# Patient Record
Sex: Male | Born: 1985 | Race: Black or African American | Hispanic: No | Marital: Single | Smoking: Never smoker
Health system: Southern US, Community
[De-identification: ages and names within clinical notes are randomized; demographics above are authoritative.]

## PROBLEM LIST (undated history)

## (undated) DIAGNOSIS — M5126 Other intervertebral disc displacement, lumbar region: Secondary | ICD-10-CM

---

## 2017-08-13 ENCOUNTER — Emergency Department (HOSPITAL_BASED_OUTPATIENT_CLINIC_OR_DEPARTMENT_OTHER)
Admission: EM | Admit: 2017-08-13 | Discharge: 2017-08-13 | Disposition: A | Discharge status: Home / Self Care | Payer: Worker's Compensation | Attending: Emergency Medicine | Admitting: Emergency Medicine

## 2017-08-13 ENCOUNTER — Encounter (HOSPITAL_BASED_OUTPATIENT_CLINIC_OR_DEPARTMENT_OTHER): Payer: Self-pay

## 2017-08-13 ENCOUNTER — Other Ambulatory Visit: Payer: Self-pay

## 2017-08-13 DIAGNOSIS — Y990 Civilian activity done for income or pay: Secondary | ICD-10-CM | POA: Insufficient documentation

## 2017-08-13 DIAGNOSIS — Y9289 Other specified places as the place of occurrence of the external cause: Secondary | ICD-10-CM | POA: Diagnosis not present

## 2017-08-13 DIAGNOSIS — T148XXA Other injury of unspecified body region, initial encounter: Secondary | ICD-10-CM

## 2017-08-13 DIAGNOSIS — W1842XA Slipping, tripping and stumbling without falling due to stepping into hole or opening, initial encounter: Secondary | ICD-10-CM | POA: Diagnosis not present

## 2017-08-13 DIAGNOSIS — Y939 Activity, unspecified: Secondary | ICD-10-CM | POA: Insufficient documentation

## 2017-08-13 DIAGNOSIS — W19XXXA Unspecified fall, initial encounter: Secondary | ICD-10-CM

## 2017-08-13 DIAGNOSIS — S8012XA Contusion of left lower leg, initial encounter: Secondary | ICD-10-CM | POA: Insufficient documentation

## 2017-08-13 DIAGNOSIS — S8011XA Contusion of right lower leg, initial encounter: Secondary | ICD-10-CM | POA: Diagnosis not present

## 2017-08-13 HISTORY — DX: Other intervertebral disc displacement, lumbar region: M51.26

## 2017-08-13 LAB — ETHANOL: Alcohol, Ethyl (B): 100 mg/dL — ABNORMAL HIGH (ref <10)

## 2017-08-13 NOTE — ED Provider Notes (Signed)
[facility] EMERGENCY DEPARTMENT Provider Note  CSN: [id number] Arrival date & time: 08/13/17 4098  Chief Complaint(s) Fall  HPI Melvin Hoffman is a 32 y.o. male who presents to the emergency department after mechanical fall while at work.  Patient reports that he stepped into uncovered hole with his right foot causing him to fall into a hole and landing on his right buttock.  He denies any head trauma or loss of consciousness.  He endorses mild right and left leg pain shortly after the fall but has been ambulating on it since.  Started noticing some mild swelling to the anterior upper right lower leg and lateral left lower leg.  Patient also endorsing mild right hip pain.  Denies any other physical complaints at this time.  HPI  Past Medical History Past Medical History:  Diagnosis Date  . Lumbar herniated disc    There are no active problems to display for this patient.  Home Medication(s) Prior to Admission medications   Not on File                                                                                                                                    Past Surgical History History reviewed. No pertinent surgical history. Family History No family history on file.  Social History Social History   Tobacco Use  . Smoking status: Never Smoker  . Smokeless tobacco: Never Used  Substance Use Topics  . Alcohol use: Yes    Comment: weekly  . Drug use: No   Allergies Patient has no known allergies.  Review of Systems Review of Systems All other systems are reviewed and are negative for acute change except as noted in the HPI  Physical Exam Vital Signs  I have reviewed the triage vital signs BP (!) 151/104 (BP Location: Right Arm)   Pulse 98   Temp 98.4 F (36.9 C) (Oral)   Resp 20   Wt 96.8 kg (213 lb 6.5 oz)   SpO2 98%   Physical Exam  Constitutional: He is oriented to person, place, and time. He appears well-developed and well-nourished. No  distress.  HENT:  Head: Normocephalic.  Right Ear: External ear normal.  Left Ear: External ear normal.  Mouth/Throat: Oropharynx is clear and moist.  Eyes: Conjunctivae and EOM are normal. Pupils are equal, round, and reactive to light. Right eye exhibits no discharge. Left eye exhibits no discharge. No scleral icterus.  Neck: Normal range of motion. Neck supple.  Cardiovascular: Regular rhythm and normal heart sounds. Exam reveals no gallop and no friction rub.  No murmur heard. Pulses:      Radial pulses are 2+ on the right side, and 2+ on the left side.       Dorsalis pedis pulses are 2+ on the right side, and 2+ on the left side.  Pulmonary/Chest: Effort normal and breath sounds normal. No stridor. No respiratory distress.  Abdominal:  Soft. He exhibits no distension. There is no tenderness.  Musculoskeletal:       Right hip: He exhibits tenderness. He exhibits normal range of motion, normal strength and no bony tenderness.       Right knee: He exhibits normal range of motion, no swelling and no deformity. No tenderness found.       Left knee: He exhibits normal range of motion, no swelling, no ecchymosis and no deformity. No tenderness found.       Right ankle: He exhibits normal range of motion and no swelling. No tenderness.       Left ankle: He exhibits normal range of motion and no swelling. No tenderness.       Cervical back: He exhibits no bony tenderness.       Thoracic back: He exhibits no bony tenderness.       Lumbar back: He exhibits no bony tenderness.       Legs: Clavicle stable. Chest stable to AP/Lat compression. Pelvis stable to Lat compression. No obvious extremity deformity. No chest or abdominal wall contusion.  Neurological: He is alert and oriented to person, place, and time. GCS eye subscore is 4. GCS verbal subscore is 5. GCS motor subscore is 6.  Moving all extremities   Skin: Skin is warm. He is not diaphoretic.    ED Results and  Treatments Labs (all labs ordered are listed, but only abnormal results are displayed) Labs Reviewed  ETHANOL                                                                                                                         EKG  EKG Interpretation  Date/Time:    Ventricular Rate:    PR Interval:    QRS Duration:   QT Interval:    QTC Calculation:   R Axis:     Text Interpretation:        Radiology No results found. Pertinent labs & imaging results that were available during my care of the patient were reviewed by me and considered in my medical decision making (see chart for details).  Medications Ordered in ED Medications - No data to display                                                                                                                                  Procedures Procedures  (including critical care time)  Medical  Decision Making / ED Course I have reviewed the nursing notes for this encounter and the patient's prior records (if available in EHR or on provided paperwork).    Injuries appear to be soft tissue contusions and abrasions.  No bony tenderness to palpation.  No obvious deformities.  No indication for imaging at this time.  He is able to ambulate without complication or significant discomfort.  The patient appears reasonably screened and/or stabilized for discharge and I doubt any other medical condition or other [facility] requiring further screening, evaluation, or treatment in the ED at this time prior to discharge.  The patient is safe for discharge with strict return precautions.   Final Clinical Impression(s) / ED Diagnoses Final diagnoses:  None   Disposition: Discharge  Condition: Good  I have discussed the results, Dx and Tx plan with the patient who expressed understanding and agree(s) with the plan. Discharge instructions discussed at great length. The patient was given strict return precautions who verbalized understanding of the  instructions. No further questions at time of discharge.    ED Discharge Orders    None       Follow Up: Primary care provider   If you do not have a primary care physician, contact [facility] at [phone] for referral      This chart was dictated using voice recognition software.  Despite best efforts to proofread,  errors can occur which can change the documentation meaning.   Nira Conn, MD 08/13/17 2153

## 2017-08-13 NOTE — ED Triage Notes (Signed)
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

## 2017-08-13 NOTE — ED Notes (Signed)
Pt did not have paperwork-advised to call supervisor to find if WC UDS is needed and let staff know-agreed

## 2018-10-18 ENCOUNTER — Emergency Department (HOSPITAL_BASED_OUTPATIENT_CLINIC_OR_DEPARTMENT_OTHER)
Admission: EM | Admit: 2018-10-18 | Discharge: 2018-10-18 | Disposition: A | Discharge status: Home / Self Care | Payer: Self-pay | Attending: Emergency Medicine | Admitting: Emergency Medicine

## 2018-10-18 ENCOUNTER — Emergency Department (HOSPITAL_BASED_OUTPATIENT_CLINIC_OR_DEPARTMENT_OTHER): Payer: Self-pay

## 2018-10-18 ENCOUNTER — Other Ambulatory Visit: Payer: Self-pay

## 2018-10-18 ENCOUNTER — Encounter (HOSPITAL_BASED_OUTPATIENT_CLINIC_OR_DEPARTMENT_OTHER): Payer: Self-pay | Admitting: Emergency Medicine

## 2018-10-18 DIAGNOSIS — M109 Gout, unspecified: Secondary | ICD-10-CM | POA: Insufficient documentation

## 2018-10-18 MED ORDER — PREDNISONE 20 MG PO TABS: 60.0000 mg | ORAL_TABLET | Freq: Every day | ORAL | 0 refills | Status: DC

## 2018-10-18 MED ORDER — NAPROXEN 500 MG PO TABS: 500.0000 mg | ORAL_TABLET | Freq: Two times a day (BID) | ORAL | 0 refills | Status: DC

## 2018-10-18 NOTE — ED Notes (Signed)
ED Provider at bedside. 

## 2018-10-18 NOTE — ED Provider Notes (Signed)
[facility] EMERGENCY DEPARTMENT Provider Note   CSN: [id number] Arrival date & time: 10/18/18  1217    History   Chief Complaint Chief Complaint  Patient presents with  . Toe Pain    HPI Melvin Hoffman is a 33 y.o. male.     Patient is a 33 year old male with no prior medical history presenting with a 5-week history of left great toe pain.  Patient states approximately 5 weeks ago is getting out of bed and he moved his foot and he felt a pop and noticed significant pain in the first MTP joint of the left foot.  He states over the next few days the symptoms worsened and he developed more swelling.  He was seen at urgent care and diagnosed with gout.  He had never had a history of gout but his father had gout and he was placed on prednisone and anti-inflammatories.  He states after 1 week symptoms are mildly improved but still present and he went back and saw someone at the [facility] care at that time was placed on colchicine and more prednisone.  He states that he took that for an additional week and symptoms had improved but not completely.  Over the last 3 to 4 days he started to have more swelling and pain again in the same area.  He does drink beer daily but has cut down as well as the amount of red meat he is eating.  He takes no acute medications otherwise and has been taking Aleve for the pain.  He does not work a job where he is on his feet for his entire shift which also makes his foot hurt worse.  He denies any known trauma, fevers or spreading of the pain into his leg.  He has no cough or shortness of breath.  The history is provided by the patient.    Past Medical History:  Diagnosis Date  . Lumbar herniated disc     There are no active problems to display for this patient.   History reviewed. No pertinent surgical history.      Home Medications    Prior to Admission medications   Medication Sig Start Date End Date Taking? Authorizing Provider   naproxen (NAPROSYN) 500 MG tablet Take 1 tablet (500 mg total) by mouth 2 (two) times daily. 10/18/18   Gwyneth Sprout, MD  predniSONE (DELTASONE) 20 MG tablet Take 3 tablets (60 mg total) by mouth daily. Take 60mg  (3 tabs) po for 5 days, then take 40mg  (2tabs) po for 2 days, then 20mg  (1 tab) po for 2 days and then 10mg  (0.5 tab)po for 2 days. 10/18/18   Gwyneth Sprout, MD    Family History No family history on file.  Social History Social History   Tobacco Use  . Smoking status: Never Smoker  . Smokeless tobacco: Never Used  Substance Use Topics  . Alcohol use: Yes    Comment: weekly  . Drug use: No     Allergies   Patient has no known allergies.   Review of Systems Review of Systems  All other systems reviewed and are negative.    Physical Exam Updated Vital Signs BP (!) 152/95 (BP Location: Right Arm)   Pulse 96   Temp 98.2 F (36.8 C) (Oral)   Resp 16   Ht 5\' 10"  (1.778 m)   Wt 98.4 kg   SpO2 98%   BMI 31.14 kg/m   Physical Exam Vitals signs and nursing note  reviewed.  Constitutional:      General: He is not in acute distress.    Appearance: Normal appearance. He is normal weight.  HENT:     Head: Normocephalic.  Eyes:     Pupils: Pupils are equal, round, and reactive to light.  Neck:     Musculoskeletal: Normal range of motion.  Cardiovascular:     Rate and Rhythm: Normal rate.     Pulses: Normal pulses.  Pulmonary:     Effort: Pulmonary effort is normal.  Musculoskeletal:        General: Tenderness present.     Left ankle: Normal.       Feet:  Neurological:     General: No focal deficit present.     Mental Status: He is alert and oriented to person, place, and time. Mental status is at baseline.  Psychiatric:        Mood and Affect: Mood normal.        Behavior: Behavior normal.        Thought Content: Thought content normal.      ED Treatments / Results  Labs (all labs ordered are listed, but only abnormal results are  displayed) Labs Reviewed - No data to display  EKG None  Radiology Dg Foot Complete Left  Result Date: 10/18/2018 CLINICAL DATA:  Left foot swelling and pain for 1 month. Treated for gout. No known injury. EXAM: LEFT FOOT - COMPLETE 3+ VIEW COMPARISON:  None. FINDINGS: The mineralization and alignment are normal. There is no evidence of acute fracture or dislocation. The joint spaces are preserved. No erosive changes are identified. There is prominent soft tissue swelling around the 1st metatarsophalangeal joint, especially medially. No soft tissue calcifications, foreign bodies or emphysema identified. IMPRESSION: Medial forefoot soft tissue swelling is suggestive of gout based on location, but nonspecific. No acute osseous findings or erosive changes identified. Electronically Signed   By: Carey BullocksWilliam  Veazey M.D.   On: 10/18/2018 13:48    Procedures Procedures (including critical care time)  Medications Ordered in ED Medications - No data to display   Initial Impression / Assessment and Plan / ED Course  I have reviewed the triage vital signs and the nursing notes.  Pertinent labs & imaging results that were available during my care of the patient were reviewed by me and considered in my medical decision making (see chart for details).       Patient presenting with symptoms most classic for gout in the left great toe however is been persistent for 5 weeks with only occasional improvement after taking prednisone.  Patient has no infectious symptoms and low suspicion for septic joint.  X-ray shows a medial forefoot soft tissue swelling suggestive of gout but no acute osseous findings or erosive changes.  Suspect this is still persistent gout and patient given a repeat prescription for prednisone and naproxen.  Also given follow-up with sports medicine if symptoms do not improve.  Patient may be a candidate for allopurinol once this flare resolves.  Final Clinical Impressions(s) / ED  Diagnoses   Final diagnoses:  Acute gout involving toe of left foot, unspecified cause    ED Discharge Orders         Ordered    naproxen (NAPROSYN) 500 MG tablet  2 times daily     10/18/18 1415    predniSONE (DELTASONE) 20 MG tablet  Daily     10/18/18 1415           Gwyneth SproutPlunkett, Mirca Yale, MD  10/18/18 1521  

## 2018-10-18 NOTE — ED Triage Notes (Signed)
L great toe pain and swelling for over a month. He was treated for gout by UC and reports no improvement.

## 2019-11-20 ENCOUNTER — Encounter (HOSPITAL_BASED_OUTPATIENT_CLINIC_OR_DEPARTMENT_OTHER): Payer: Self-pay | Admitting: Emergency Medicine

## 2019-11-20 ENCOUNTER — Emergency Department (HOSPITAL_BASED_OUTPATIENT_CLINIC_OR_DEPARTMENT_OTHER): Payer: Self-pay

## 2019-11-20 ENCOUNTER — Emergency Department (HOSPITAL_BASED_OUTPATIENT_CLINIC_OR_DEPARTMENT_OTHER)
Admission: EM | Admit: 2019-11-20 | Discharge: 2019-11-20 | Disposition: A | Discharge status: Home / Self Care | Payer: Self-pay | Attending: Emergency Medicine | Admitting: Emergency Medicine

## 2019-11-20 ENCOUNTER — Other Ambulatory Visit: Payer: Self-pay

## 2019-11-20 DIAGNOSIS — Y9367 Activity, basketball: Secondary | ICD-10-CM | POA: Insufficient documentation

## 2019-11-20 DIAGNOSIS — Y929 Unspecified place or not applicable: Secondary | ICD-10-CM | POA: Insufficient documentation

## 2019-11-20 DIAGNOSIS — X501XXA Overexertion from prolonged static or awkward postures, initial encounter: Secondary | ICD-10-CM | POA: Insufficient documentation

## 2019-11-20 DIAGNOSIS — S93491A Sprain of other ligament of right ankle, initial encounter: Secondary | ICD-10-CM | POA: Insufficient documentation

## 2019-11-20 DIAGNOSIS — Y999 Unspecified external cause status: Secondary | ICD-10-CM | POA: Insufficient documentation

## 2019-11-20 NOTE — Discharge Instructions (Signed)
Please read and follow all provided instructions.  Your diagnoses today include:  1. Sprain of anterior talofibular ligament of right ankle, initial encounter     Tests performed today include:  An x-ray of your ankle - does NOT show any broken bones  Vital signs. See below for your results today.   Medications prescribed:  Please use over-the-counter NSAID medications (ibuprofen, naproxen) as directed on the packaging for pain.   Take any prescribed medications only as directed.  Home care instructions:   Follow any educational materials contained in this packet  Follow R.I.C.E. Protocol:  R - rest your injury   I  - use ice on injury without applying directly to skin  C - compress injury with bandage or splint  E - elevate the injury as much as possible  Follow-up instructions: Please follow-up with your primary care provider or the provided orthopedic (bone specialist) if you continue to have significant pain or trouble walking in 1 week. In this case you may have a severe sprain that requires further care.   Return instructions:   Please return if your toes are numb or tingling, appear gray or blue, or you have severe pain (also elevate leg and loosen splint or wrap)  Please return to the Emergency Department if you experience worsening symptoms.   Please return if you have any other emergent concerns.  Additional Information:  Your vital signs today were: BP (!) 149/107 (BP Location: Right Arm)    Pulse 82    Temp 98.1 F (36.7 C) (Oral)    Resp 18    Ht 5\' 9"  (1.753 m)    Wt 93 kg    SpO2 97%    BMI 30.27 kg/m  If your blood pressure (BP) was elevated above 135/85 this visit, please have this repeated by your doctor within one month. -------------- Your caregiver has diagnosed you as suffering from an ankle sprain. Ankle sprain occurs when the ligaments that hold the ankle joint together are stretched or torn. It may take 4 to 6 weeks to heal.  For Activity:  If prescribed crutches, use crutches with non-weight bearing for the first few days. Then, you may walk on your ankle as the pain allows, or as instructed. Start gradually with weight bearing on the affected ankle. Once you can walk pain free, then try jogging. When you can run forwards, then you can try moving side-to-side. If you cannot walk without crutches in one week, you need a re-check. --------------

## 2019-11-20 NOTE — ED Provider Notes (Signed)
[facility] EMERGENCY DEPARTMENT Provider Note   CSN: [id number] Arrival date & time: 11/20/19  1131     History Chief Complaint  Patient presents with  . Ankle Injury    Melvin Hoffman is a 34 y.o. male.  Patient presents to the emergency department for right ankle injury sustained 1 week ago.  Patient states that he was playing basketball and jumped and landed awkwardly.  He rolled over on his left ankle.  Since that time he has had pain and swelling.  He has been ambulatory.  Pain is worse at night.  Home meds have not been helping.  Pain radiates up into the calf without swelling.  No knee pain.        Past Medical History:  Diagnosis Date  . Lumbar herniated disc     There are no problems to display for this patient.   History reviewed. No pertinent surgical history.     No family history on file.  Social History   Tobacco Use  . Smoking status: Never Smoker  . Smokeless tobacco: Never Used  Substance Use Topics  . Alcohol use: Yes    Comment: weekly  . Drug use: No    Home Medications Prior to Admission medications   Not on File    Allergies    Patient has no known allergies.  Review of Systems   Review of Systems  Constitutional: Negative for activity change.  Musculoskeletal: Positive for arthralgias and myalgias. Negative for back pain, gait problem, joint swelling and neck pain.  Skin: Negative for wound.  Neurological: Negative for weakness and numbness.    Physical Exam Updated Vital Signs BP (!) 149/107 (BP Location: Right Arm)   Pulse 82   Temp 98.1 F (36.7 C) (Oral)   Resp 18   Ht 5\' 9"  (1.753 m)   Wt 93 kg   SpO2 97%   BMI 30.27 kg/m   Physical Exam Vitals reviewed.  Constitutional:      Appearance: He is well-developed.  HENT:     Head: Normocephalic and atraumatic.  Eyes:     Conjunctiva/sclera: Conjunctivae normal.  Cardiovascular:     Pulses:          Dorsalis pedis pulses are 2+ on the right side and 2+  on the left side.       Posterior tibial pulses are 2+ on the right side and 2+ on the left side.  Pulmonary:     Effort: No respiratory distress.  Musculoskeletal:        General: Tenderness present.     Cervical back: Normal range of motion and neck supple.     Right ankle: Tenderness present over the lateral malleolus. No base of 5th metatarsal or proximal fibula tenderness. Normal range of motion.  Skin:    General: Skin is warm and dry.  Neurological:     Mental Status: He is alert.     Comments: Distal motor, sensation, and vascular intact.     ED Results / Procedures / Treatments   Labs (all labs ordered are listed, but only abnormal results are displayed) Labs Reviewed - No data to display  EKG None  Radiology DG Ankle Complete Right  Result Date: 11/20/2019 CLINICAL DATA:  RIGHT ankle pain.  Basketball injury. EXAM: RIGHT ANKLE - COMPLETE 3+ VIEW COMPARISON:  None. FINDINGS: Ankle mortise intact. The talar dome is normal. No malleolar fracture. The calcaneus is normal. Small joint effusion anterior to the tibiotalar joint. IMPRESSION: 1.  Small joint effusion.  No fracture dislocation. Electronically Signed   By: Genevive Bi M.D.   On: 11/20/2019 11:58    Procedures Procedures (including critical care time)  Medications Ordered in ED Medications - No data to display  ED Course  I have reviewed the triage vital signs and the nursing notes.  Pertinent labs & imaging results that were available during my care of the patient were reviewed by me and considered in my medical decision making (see chart for details).  Patient seen and examined.  X-rays ordered.  Vital signs reviewed and are as follows: BP (!) 149/107 (BP Location: Right Arm)   Pulse 82   Temp 98.1 F (36.7 C) (Oral)   Resp 18   Ht 5\' 9"  (1.753 m)   Wt 93 kg   SpO2 97%   BMI 30.27 kg/m   Patient was counseled on RICE protocol and told to rest injury, use ice for no longer than 15 minutes  every hour, compress the area, and elevate above the level of their heart as much as possible to reduce swelling. Questions answered. Patient verbalized understanding.    X-rays negative.  Encouraged orthopedic follow-up as needed.  Will provide ASO.     MDM Rules/Calculators/A&P                      Patient with ankle injury.  X-rays are negative.  Will continue symptomatic treatment with sports medicine follow-up as needed.   Final Clinical Impression(s) / ED Diagnoses Final diagnoses:  Sprain of anterior talofibular ligament of right ankle, initial encounter    Rx / DC Orders ED Discharge Orders    None       , PA-C 11/20/19 1205    11/22/19, MD 12/01/19 1435

## 2019-11-20 NOTE — ED Notes (Signed)
Portable Xray at bedside.

## 2019-11-20 NOTE — ED Triage Notes (Signed)
He rolled his R ankle 1 week ago playing basketball.

## 2019-12-06 ENCOUNTER — Encounter (HOSPITAL_BASED_OUTPATIENT_CLINIC_OR_DEPARTMENT_OTHER): Payer: Self-pay

## 2019-12-06 ENCOUNTER — Other Ambulatory Visit: Payer: Self-pay

## 2019-12-06 ENCOUNTER — Emergency Department (HOSPITAL_BASED_OUTPATIENT_CLINIC_OR_DEPARTMENT_OTHER)
Admission: EM | Admit: 2019-12-06 | Discharge: 2019-12-06 | Disposition: A | Discharge status: Home / Self Care | Payer: Self-pay | Attending: Emergency Medicine | Admitting: Emergency Medicine

## 2019-12-06 ENCOUNTER — Emergency Department (HOSPITAL_BASED_OUTPATIENT_CLINIC_OR_DEPARTMENT_OTHER): Payer: Self-pay

## 2019-12-06 DIAGNOSIS — Y9367 Activity, basketball: Secondary | ICD-10-CM | POA: Insufficient documentation

## 2019-12-06 DIAGNOSIS — W010XXD Fall on same level from slipping, tripping and stumbling without subsequent striking against object, subsequent encounter: Secondary | ICD-10-CM | POA: Insufficient documentation

## 2019-12-06 DIAGNOSIS — S93431D Sprain of tibiofibular ligament of right ankle, subsequent encounter: Secondary | ICD-10-CM | POA: Insufficient documentation

## 2019-12-06 DIAGNOSIS — Y999 Unspecified external cause status: Secondary | ICD-10-CM | POA: Insufficient documentation

## 2019-12-06 DIAGNOSIS — Y9289 Other specified places as the place of occurrence of the external cause: Secondary | ICD-10-CM | POA: Insufficient documentation

## 2019-12-06 DIAGNOSIS — S93491D Sprain of other ligament of right ankle, subsequent encounter: Secondary | ICD-10-CM

## 2019-12-06 MED ORDER — DICLOFENAC SODIUM 1 % EX GEL: 2.0000 g | Freq: Four times a day (QID) | CUTANEOUS | 0 refills | Status: AC | PRN

## 2019-12-06 MED ORDER — IBUPROFEN 800 MG PO TABS
800.0000 mg | ORAL_TABLET | Freq: Once | ORAL | Status: AC
  Administered 2019-12-06: 800 mg via ORAL
  Filled 2019-12-06: qty 1

## 2019-12-06 MED ORDER — CYCLOBENZAPRINE HCL 10 MG PO TABS: 10.0000 mg | ORAL_TABLET | Freq: Two times a day (BID) | ORAL | 0 refills | Status: AC | PRN

## 2019-12-06 NOTE — ED Triage Notes (Signed)
Pt arrives with continued pain to right ankle from visit on 5/29, tried to follow up with sports medicine, unable to get apt until Wednesday pt reports increasing pain and unable to put weight on right ankle.

## 2019-12-06 NOTE — Discharge Instructions (Addendum)
Fortunately your repeat x-ray did not show any evidence of bony fracture or dislocation.  Keep your ankle elevated while at rest.  Use crutches as needed.  Follow-up with your orthopedist in 2 days as previously scheduled.  You may take Flexeril as needed at nighttime to help you sleep and use Voltaren gel up to 4 times daily for pain.

## 2019-12-06 NOTE — ED Notes (Signed)
Patient transported to X-ray 

## 2019-12-06 NOTE — ED Provider Notes (Signed)
[facility] EMERGENCY DEPARTMENT Provider Note   CSN: [id number] Arrival date & time: 12/06/19  1231     History Chief Complaint  Patient presents with  . Ankle Pain    Melvin Hoffman is a 34 y.o. male.  The history is provided by the patient and medical records. No language interpreter was used.  Ankle Pain Associated symptoms: no fever      34 year old male presenting for evaluation of ankle pain.  Approximately 2 weeks ago patient injured his right ankle when he was playing basketball, jumped and landed awkwardly on ankle and fell down. He was seen in the ER the next day for his ankle injury. An x-ray obtained at that time showed no evidence of acute fracture or dislocation. Patient was recommended rice therapy as well as wearing an ankle brace for support. Patient report his pain has steadily became worse in the past 2 weeks. Pain is sharp for pain radiates towards his mid shin, worse with ambulation and swelling is still present. Pain is not well controlled with anti-inflammatory medication, heat, ice, Epsom salt bath. No fever, no calf pain, no numbness. He does have an appointment with sports medicine specialist in 2 days however due to the progressiveness of his pain he difficulty sleeping at night he is here for evaluation. No prior history of PE or DVT.  Past Medical History:  Diagnosis Date  . Lumbar herniated disc     There are no problems to display for this patient.   History reviewed. No pertinent surgical history.     No family history on file.  Social History   Tobacco Use  . Smoking status: Never Smoker  . Smokeless tobacco: Never Used  Substance Use Topics  . Alcohol use: Yes    Comment: weekly  . Drug use: No    Home Medications Prior to Admission medications   Not on File    Allergies    Patient has no known allergies.  Review of Systems   Review of Systems  Constitutional: Negative for fever.  Musculoskeletal: Positive for  arthralgias and myalgias.  Skin: Negative for rash and wound.  Neurological: Negative for numbness.    Physical Exam Updated Vital Signs BP (!) 155/114 (BP Location: Right Arm)   Pulse (!) 108   Temp 98.6 F (37 C) (Oral)   Resp 18   Ht 5\' 10"  (1.778 m)   Wt 93.9 kg   SpO2 100%   BMI 29.70 kg/m   Physical Exam Vitals and nursing note reviewed.  Constitutional:      General: He is not in acute distress.    Appearance: He is well-developed.  HENT:     Head: Atraumatic.  Eyes:     Conjunctiva/sclera: Conjunctivae normal.  Musculoskeletal:        General: Tenderness (Right ankle: Exquisite tenderness noted to lateral malleoli region with associated edema but no crepitus. Decreased range of motion secondary to pain. Intact dorsalis pedis pulse with brisk cap refill. No significant tenderness to fifth metatarsal region. ) present.     Cervical back: Neck supple.     Comments: Right leg calf soft and nontender.  Skin:    Findings: No rash.  Neurological:     Mental Status: He is alert.     ED Results / Procedures / Treatments   Labs (all labs ordered are listed, but only abnormal results are displayed) Labs Reviewed - No data to display  EKG None  Radiology DG Ankle Complete Right  Result Date: 12/06/2019 CLINICAL DATA:  Acute right ankle pain. EXAM: RIGHT ANKLE - COMPLETE 3+ VIEW COMPARISON:  Nov 20, 2019. FINDINGS: There is no evidence of fracture or dislocation. Small joint effusion may remain. There is no evidence of arthropathy or other focal bone abnormality. Soft tissues are unremarkable. IMPRESSION: No fracture or dislocation is noted. Small joint effusion may be present. Electronically Signed   By: Marijo Conception M.D.   On: 12/06/2019 14:44    Procedures Procedures (including critical care time)  Medications Ordered in ED Medications  ibuprofen (ADVIL) tablet 800 mg (800 mg Oral Given 12/06/19 1417)    ED Course  I have reviewed the triage vital signs  and the nursing notes.  Pertinent labs & imaging results that were available during my care of the patient were reviewed by me and considered in my medical decision making (see chart for details).    MDM Rules/Calculators/A&P                          BP (!) 155/114 (BP Location: Right Arm)   Pulse (!) 108   Temp 98.6 F (37 C) (Oral)   Resp 18   Ht 5\' 10"  (1.778 m)   Wt 93.9 kg   SpO2 100%   BMI 29.70 kg/m   Final Clinical Impression(s) / ED Diagnoses Final diagnoses:  Sprain of anterior talofibular ligament of right ankle, subsequent encounter    Rx / DC Orders ED Discharge Orders         Ordered    diclofenac Sodium (VOLTAREN) 1 % GEL  4 times daily PRN     Discontinue  Reprint     12/06/19 1512    cyclobenzaprine (FLEXERIL) 10 MG tablet  2 times daily PRN     Discontinue  Reprint     12/06/19 1512         2:18 PM Patient sprained his right ankle 2 weeks ago. X-ray at that time was negative however despite rice therapy he report progressive worsening pain and swelling to the ankle. Will obtain repeat x-ray. No evidence of infection. Low suspicion for DVT.  3:08 PM Repeat right ankle x-ray shows no acute fracture or dislocation.  Small joint effusion were noted.  At this time, I strongly encourage patient to keep his leg elevated while at rest.  Crutches provided.  Patient should follow-up with orthopedist in 2 days as previously scheduled.  We will also prescribe Voltaren gel.   Domenic Moras, PA-C 12/06/19 1514    Virgel Manifold, MD 12/07/19 [phone]

## 2019-12-08 ENCOUNTER — Ambulatory Visit: Payer: Self-pay

## 2019-12-08 ENCOUNTER — Encounter: Payer: Self-pay | Admitting: Family Medicine

## 2019-12-08 ENCOUNTER — Ambulatory Visit (INDEPENDENT_AMBULATORY_CARE_PROVIDER_SITE_OTHER): Payer: Self-pay | Admitting: Family Medicine

## 2019-12-08 ENCOUNTER — Other Ambulatory Visit: Payer: Self-pay

## 2019-12-08 VITALS — BP 158/118 | HR 111 | Ht 70.0 in | Wt 210.0 lb

## 2019-12-08 DIAGNOSIS — M25471 Effusion, right ankle: Secondary | ICD-10-CM | POA: Insufficient documentation

## 2019-12-08 DIAGNOSIS — M25571 Pain in right ankle and joints of right foot: Secondary | ICD-10-CM

## 2019-12-08 MED ORDER — HYDROCODONE-ACETAMINOPHEN 5-325 MG PO TABS: 1.0000 | ORAL_TABLET | Freq: Three times a day (TID) | ORAL | 0 refills | Status: DC | PRN

## 2019-12-08 NOTE — Progress Notes (Signed)
Melvin Hoffman - 34 y.o. male MRN [id number]  Date of birth: 11/14/1985  SUBJECTIVE:  Including CC & ROS.  Chief Complaint  Patient presents with  . Ankle Injury    right x 11/19/2019    Melvin Hoffman is a 34 y.o. male that is presenting with right ankle pain.  The pain is been ongoing since 5/28.  His feet came down with the rebound had a inversion injury.  He has been having severe pain since that time.  He has been using crutches and an ankle brace.  No history of similar pain.  He has some altered sensation over the first and second digit of the right foot.  Limited range of motion of his foot..  Independent review of the right ankle x-ray from 5/29 shows an effusion of the joint but no fracture.   Review of Systems See HPI   HISTORY: Past Medical, Surgical, Social, and Family History Reviewed & Updated per EMR.   Pertinent Historical Findings include:  Past Medical History:  Diagnosis Date  . Lumbar herniated disc     No past surgical history on file.  No family history on file.  Social History   Socioeconomic History  . Marital status: Single    Spouse name: Not on file  . Number of children: Not on file  . Years of education: Not on file  . Highest education level: Not on file  Occupational History  . Not on file  Tobacco Use  . Smoking status: Never Smoker  . Smokeless tobacco: Never Used  Substance and Sexual Activity  . Alcohol use: Yes    Comment: weekly  . Drug use: No  . Sexual activity: Not on file  Other Topics Concern  . Not on file  Social History Narrative  . Not on file   Social Determinants of Health   Financial Resource Strain:   . Difficulty of Paying Living Expenses:   Food Insecurity:   . Worried About Programme researcher, broadcasting/film/video in the Last Year:   . Barista in the Last Year:   Transportation Needs:   . Freight forwarder (Medical):   Marland Kitchen Lack of Transportation (Non-Medical):   Physical Activity:   . Days of Exercise per Week:   .  Minutes of Exercise per Session:   Stress:   . Feeling of Stress :   Social Connections:   . Frequency of Communication with Friends and Family:   . Frequency of Social Gatherings with Friends and Family:   . Attends Religious Services:   . Active Member of Clubs or Organizations:   . Attends Banker Meetings:   Marland Kitchen Marital Status:   Intimate Partner Violence:   . Fear of Current or Ex-Partner:   . Emotionally Abused:   Marland Kitchen Physically Abused:   . Sexually Abused:      PHYSICAL EXAM:  VS: BP (!) 158/118   Pulse (!) 111   Ht 5\' 10"  (1.778 m)   Wt 210 lb (95.3 kg)   BMI 30.13 kg/m  Physical Exam Gen: NAD, alert, cooperative with exam, well-appearing MSK:  Right ankle/foot: Limited range of motion. Swelling of the foot and ankle. Pain with anterior drawer. Neurovascular intact  Limited ultrasound: Right ankle/foot:  Significant effusion from the ankle joint itself. Posterior tibialis is intact with effusion emanating from the joint encircling the tendon. Peroneal tendons intact at the lateral malleolus.  Normal-appearing peroneal brevis at the insertion of the base of the fifth.  Hyperemia occurring over the anterior lateral ligaments as well as the medial ligaments.  Summary: Concern for significant disruption of ankle ligaments as well as chondral lesion of the joint causing the effusion.  Ultrasound and interpretation by Clearance Coots, MD    ASSESSMENT & PLAN:   Ankle effusion, right Injury occurred on 5/28.  He has had ongoing pain and limited range of motion since that time.  Concern for significant derangement with ligamentous disruption as well as a chondral injury with a large joint effusion. -Counseled on home exercise therapy and supportive care. -Cam walker. -Provided Duexis samples. -Norco -MRI to evaluate for structural abnormalities.  An effusion

## 2019-12-08 NOTE — Progress Notes (Signed)
Medication Samples have been provided to the patient.  Drug name: Duexis       Strength: 800mg /26.6mg         Qty: 2 Boxes  LOT  Exp.Date: 07/2020  Dosing instructions: Take 1 tablet by mouth three (3) times a day.  The patient has been instructed regarding the correct time, dose, and frequency of taking this medication, including desired effects and most common side effects.   08/2020, MA 11:59 AM 12/08/2019

## 2019-12-08 NOTE — Assessment & Plan Note (Signed)
Injury occurred on 5/28.  He has had ongoing pain and limited range of motion since that time.  Concern for significant derangement with ligamentous disruption as well as a chondral injury with a large joint effusion. -Counseled on home exercise therapy and supportive care. -Cam walker. -Provided Duexis samples. -Norco -MRI to evaluate for structural abnormalities.  An effusion

## 2019-12-08 NOTE — Patient Instructions (Signed)
Nice to meet you Please try a CAM walker  Please try ice  Please use the duexis on a regular basis  Please use the norco for severe pain   Please send me a message in MyChart with any questions or updates.  We will schedule a virtual visit once the MRi is resulted.   --Dr. Jordan Likes

## 2019-12-13 NOTE — Addendum Note (Signed)
Addended by: Kathi Simpers F on: 12/13/2019 03:30 PM   Modules accepted: Orders

## 2019-12-15 ENCOUNTER — Other Ambulatory Visit: Payer: Self-pay

## 2019-12-15 ENCOUNTER — Ambulatory Visit
Admission: RE | Admit: 2019-12-15 | Discharge: 2019-12-15 | Disposition: A | Discharge status: Home / Self Care | Payer: Self-pay | Source: Ambulatory Visit | Attending: Family Medicine | Admitting: Family Medicine

## 2019-12-15 DIAGNOSIS — M25471 Effusion, right ankle: Secondary | ICD-10-CM

## 2019-12-16 ENCOUNTER — Other Ambulatory Visit: Payer: Self-pay | Admitting: Family Medicine

## 2019-12-16 ENCOUNTER — Encounter: Payer: Self-pay | Admitting: Family Medicine

## 2019-12-16 MED ORDER — HYDROCODONE-ACETAMINOPHEN 5-325 MG PO TABS: 1.0000 | ORAL_TABLET | Freq: Three times a day (TID) | ORAL | 0 refills | Status: DC | PRN

## 2019-12-16 NOTE — Addendum Note (Signed)
Addended by: Myra Rude on: 12/16/2019 05:04 PM   Modules accepted: Orders

## 2019-12-16 NOTE — Telephone Encounter (Signed)
Medication Samples have been provided to the patient.  Drug name: duexis       Strength: 800/26.6mg         Qty: 2 boxes  LOT: [id number]  Exp.Date: 07/2020 Dosing instructions: take one tab three times daily The patient has been instructed regarding the correct time, dose, and frequency of taking this medication, including desired effects and most common side effects.   Lillia Pauls 4:46 PM 12/16/2019

## 2019-12-16 NOTE — Telephone Encounter (Signed)
Patient requesting refill of [facility] [address] [facility].   Patient had his MRI yesterday. We are waiting on results to schedule virtual visit

## 2019-12-16 NOTE — Telephone Encounter (Signed)
Provided norco   Myra Rude, MD [facility] 12/16/2019, 5:04 PM

## 2019-12-17 ENCOUNTER — Other Ambulatory Visit: Payer: Self-pay

## 2019-12-17 ENCOUNTER — Telehealth (INDEPENDENT_AMBULATORY_CARE_PROVIDER_SITE_OTHER): Payer: Self-pay | Admitting: Family Medicine

## 2019-12-17 DIAGNOSIS — M25471 Effusion, right ankle: Secondary | ICD-10-CM

## 2019-12-17 NOTE — Progress Notes (Signed)
Virtual Visit via Video Note  I connected with Marily Lente on 12/17/19 at 11:45 AM EDT by a video enabled telemedicine application and verified that I am speaking with the correct person using two identifiers.   I discussed the limitations of evaluation and management by telemedicine and the availability of in person appointments. The patient expressed understanding and agreed to proceed.  Patient: vehicle  Physician: office  History of Present Illness:   Mr. Bencomo is a 34 year old male that is following up for the MRI of the right ankle.  The MRI was revealing for a complete tear of the anterior and inferior tibiofibular ligament and signs consistent with a syndesmosis injury.  There is a 0.3 cm osteochondral lesion of the medial talar dome there is a synovitis of the tibiotalar joint and effusion.  There are multiple bone contusions.  Observations/Objective:  Gen: NAD, alert, cooperative with exam, well-appearing  Assessment and Plan:  Tear of the ATFL, ankle sprain, osteochondral defect, bony contusion: He has multiple injuries to his ankle.  He has an osteochondral defect as well as tear of the ATFL and a high ankle sprain.  He has bone contusions and joint effusion. -Counseled on home exercise therapy and supportive care. -We can consider injection in the joint  -Continue the cam walker and partial weightbearing.  Follow Up Instructions:    I discussed the assessment and treatment plan with the patient. The patient was provided an opportunity to ask questions and all were answered. The patient agreed with the plan and demonstrated an understanding of the instructions.   The patient was advised to call back or seek an in-person evaluation if the symptoms worsen or if the condition fails to improve as anticipated.   Clare Gandy, MD

## 2019-12-17 NOTE — Assessment & Plan Note (Signed)
He has multiple injuries to his ankle.  He has an osteochondral defect as well as tear of the ATFL and a high ankle sprain.  He has bone contusions and joint effusion. -Counseled on home exercise therapy and supportive care. -We can consider injection in the joint  -Continue the cam walker and partial weightbearing.

## 2019-12-18 ENCOUNTER — Other Ambulatory Visit: Payer: Self-pay

## 2019-12-20 ENCOUNTER — Other Ambulatory Visit: Payer: Self-pay

## 2019-12-21 ENCOUNTER — Other Ambulatory Visit: Payer: Self-pay

## 2019-12-21 ENCOUNTER — Ambulatory Visit (INDEPENDENT_AMBULATORY_CARE_PROVIDER_SITE_OTHER): Payer: Self-pay | Admitting: Family Medicine

## 2019-12-21 ENCOUNTER — Encounter: Payer: Self-pay | Admitting: Family Medicine

## 2019-12-21 ENCOUNTER — Ambulatory Visit: Payer: Self-pay

## 2019-12-21 VITALS — BP 153/96 | Ht 71.0 in | Wt 210.0 lb

## 2019-12-21 DIAGNOSIS — S93491D Sprain of other ligament of right ankle, subsequent encounter: Secondary | ICD-10-CM

## 2019-12-21 DIAGNOSIS — S93491A Sprain of other ligament of right ankle, initial encounter: Secondary | ICD-10-CM | POA: Insufficient documentation

## 2019-12-21 DIAGNOSIS — M949 Disorder of cartilage, unspecified: Secondary | ICD-10-CM

## 2019-12-21 DIAGNOSIS — M25471 Effusion, right ankle: Secondary | ICD-10-CM

## 2019-12-21 DIAGNOSIS — M899 Disorder of bone, unspecified: Secondary | ICD-10-CM

## 2019-12-21 MED ORDER — TRIAMCINOLONE ACETONIDE 40 MG/ML IJ SUSP
40.0000 mg | Freq: Once | INTRAMUSCULAR | Status: AC
  Administered 2019-12-21: 10:00:00 40 mg via INTRA_ARTICULAR

## 2019-12-21 NOTE — Assessment & Plan Note (Signed)
Injury occurred on 5/28. MRI revealing  - continue partial or light weight bearing.

## 2019-12-21 NOTE — Patient Instructions (Signed)
Good to see you Please try ice   Please send me a message in MyChart with any questions or updates.  Please see me back in 4 weeks.   --Dr. Antwan Bribiesca  

## 2019-12-21 NOTE — Assessment & Plan Note (Signed)
MRI was revealing for near complete tear of the anterior, inferior tibiofibular ligament and high grade sprain. Instability hard to determine with amount of pain on exam - may need referral if unstable on future exams.  - continue CAM walker

## 2019-12-21 NOTE — Progress Notes (Signed)
Melvin Hoffman - 34 y.o. male MRN [id number]  Date of birth: 1985/12/05  SUBJECTIVE:  Including CC & ROS.  Chief Complaint  Patient presents with  . Follow-up    right ankle    Melvin Hoffman is a 34 y.o. male that is following up for his right ankle pain. MRI was showing high ankle sprain and ocd lesion.    Review of Systems See HPI   HISTORY: Past Medical, Surgical, Social, and Family History Reviewed & Updated per EMR.   Pertinent Historical Findings include:  Past Medical History:  Diagnosis Date  . Lumbar herniated disc     No past surgical history on file.  No family history on file.  Social History   Socioeconomic History  . Marital status: Single    Spouse name: Not on file  . Number of children: Not on file  . Years of education: Not on file  . Highest education level: Not on file  Occupational History  . Not on file  Tobacco Use  . Smoking status: Never Smoker  . Smokeless tobacco: Never Used  Substance and Sexual Activity  . Alcohol use: Yes    Comment: weekly  . Drug use: No  . Sexual activity: Not on file  Other Topics Concern  . Not on file  Social History Narrative  . Not on file   Social Determinants of Health   Financial Resource Strain:   . Difficulty of Paying Living Expenses:   Food Insecurity:   . Worried About Programme researcher, broadcasting/film/video in the Last Year:   . Barista in the Last Year:   Transportation Needs:   . Freight forwarder (Medical):   Marland Kitchen Lack of Transportation (Non-Medical):   Physical Activity:   . Days of Exercise per Week:   . Minutes of Exercise per Session:   Stress:   . Feeling of Stress :   Social Connections:   . Frequency of Communication with Friends and Family:   . Frequency of Social Gatherings with Friends and Family:   . Attends Religious Services:   . Active Member of Clubs or Organizations:   . Attends Banker Meetings:   Marland Kitchen Marital Status:   Intimate Partner Violence:   . Fear of Current or  Ex-Partner:   . Emotionally Abused:   Marland Kitchen Physically Abused:   . Sexually Abused:      PHYSICAL EXAM:  VS: BP (!) 153/96   Ht 5\' 11"  (1.803 m)   Wt 210 lb (95.3 kg)   BMI 29.29 kg/m  Physical Exam Gen: NAD, alert, cooperative with exam, well-appearing   Aspiration/Injection Procedure Note Melvin Hoffman [birth year]  Procedure: Injection Indications: Right ankle pain  Procedure Details Consent: Risks of procedure as well as the alternatives and risks of each were explained to the (patient/caregiver).  Consent for procedure obtained. Time Out: Verified patient identification, verified procedure, site/side was marked, verified correct patient position, special equipment/implants available, medications/allergies/relevent history reviewed, required imaging and test results available.  Performed.  The area was cleaned with iodine and alcohol swabs.    The right ankle joint was injected using 1 cc's of 40 mg Kenalog and 2 cc's of 0.25% bupivacaine with a 25 1 1/2" needle.  Ultrasound was used. Images were obtained in long views showing the injection.     A sterile dressing was applied.  Patient did tolerate procedure well.     ASSESSMENT & PLAN:   Osteochondral lesion of talar dome Injury  occurred on 5/28. MRI revealing  - continue partial or light weight bearing.    Ankle effusion, right Effusion ongoing since injury  - injection today   Sprain of anterior talofibular ligament of right ankle MRI was revealing for near complete tear of the anterior, inferior tibiofibular ligament and high grade sprain. Instability hard to determine with amount of pain on exam - may need referral if unstable on future exams.  - continue CAM walker

## 2019-12-21 NOTE — Assessment & Plan Note (Signed)
Effusion ongoing since injury  - injection today

## 2019-12-23 ENCOUNTER — Telehealth: Payer: Self-pay | Admitting: Family Medicine

## 2019-12-23 ENCOUNTER — Other Ambulatory Visit: Payer: Self-pay | Admitting: Family Medicine

## 2019-12-23 MED ORDER — HYDROCODONE-ACETAMINOPHEN 5-325 MG PO TABS: 1.0000 | ORAL_TABLET | Freq: Three times a day (TID) | ORAL | 0 refills | Status: DC | PRN

## 2019-12-23 MED ORDER — HYDROCODONE-ACETAMINOPHEN 5-325 MG PO TABS
1.0000 | ORAL_TABLET | Freq: Three times a day (TID) | ORAL | 0 refills | Status: DC | PRN
Start: 2019-12-23 — End: 2019-12-23

## 2019-12-23 NOTE — Telephone Encounter (Signed)
Patient requesting refill of Hydrocodone. [facility] [address] He will be out of medication tomorrow.   Patient would like to know if he should continue taking Duexis as well. He does say it helps with his pain. He has three days left of the [facility]

## 2019-12-23 NOTE — Telephone Encounter (Signed)
Provided refill of norco.   Myra Rude, MD [facility] 12/23/2019, 1:25 PM

## 2019-12-29 ENCOUNTER — Telehealth: Payer: Self-pay | Admitting: Family Medicine

## 2019-12-29 DIAGNOSIS — M949 Disorder of cartilage, unspecified: Secondary | ICD-10-CM

## 2019-12-29 DIAGNOSIS — M25471 Effusion, right ankle: Secondary | ICD-10-CM

## 2019-12-29 DIAGNOSIS — S93491D Sprain of other ligament of right ankle, subsequent encounter: Secondary | ICD-10-CM

## 2019-12-29 MED ORDER — HYDROCODONE-ACETAMINOPHEN 5-325 MG PO TABS: 1.0000 | ORAL_TABLET | Freq: Three times a day (TID) | ORAL | 0 refills | Status: DC | PRN

## 2019-12-29 NOTE — Telephone Encounter (Signed)
Patient calling requesting refill of hydrocodone.  [facility] [address]

## 2019-12-29 NOTE — Telephone Encounter (Signed)
Refilled norco. He is still having significant pain despite treatment. Will send to ortho surgery.   Myra Rude, MD [facility] 12/29/2019, 5:20 PM

## 2020-01-04 ENCOUNTER — Telehealth: Payer: Self-pay | Admitting: Family Medicine

## 2020-01-04 DIAGNOSIS — M25471 Effusion, right ankle: Secondary | ICD-10-CM

## 2020-01-04 DIAGNOSIS — M899 Disorder of bone, unspecified: Secondary | ICD-10-CM

## 2020-01-04 DIAGNOSIS — S93491D Sprain of other ligament of right ankle, subsequent encounter: Secondary | ICD-10-CM

## 2020-01-04 MED ORDER — HYDROCODONE-ACETAMINOPHEN 5-325 MG PO TABS: 1.0000 | ORAL_TABLET | Freq: Three times a day (TID) | ORAL | 0 refills | Status: DC | PRN

## 2020-01-04 NOTE — Telephone Encounter (Signed)
Refilled norco. Has been referred to ortho. Would await their decision before making further refills.   Myra Rude, MD [facility] 01/04/2020, 1:33 PM

## 2020-01-04 NOTE — Telephone Encounter (Signed)
Patient called to request Rx refill on :  HYDROcodone-acetaminophen (NORCO/VICODIN) 5-325 MG tablet [[id number]]   Order Details Dose: 1 tablet Route: Oral Frequency: Every 8 hours PRN  Dispense Quantity: 15 tablet Refills: 0       Sig: Take 1 tablet by mouth every 8 (eight) hours as needed.       --Forwarding request to provider for review & approval,  --glh

## 2020-01-11 ENCOUNTER — Other Ambulatory Visit: Payer: Self-pay

## 2020-01-11 ENCOUNTER — Telehealth: Payer: Self-pay

## 2020-01-11 MED ORDER — DUEXIS 800-26.6 MG PO TABS: ORAL_TABLET | ORAL | 0 refills | Status: AC

## 2020-01-11 NOTE — Telephone Encounter (Signed)
Informed pt that Dr. Jordan Likes is out of the office this week and Dr. Pearletha Forge is checking his messages. Discussed with pt the option to apply for the [facility] financial assistance, and if accepted he can see an orthopedist with [facility]. He can also call [facility] to check on cost. In the meantime we will refill his Duexis.  Pt understands and agrees with the plan. Info emailed to pt. He will call us if he'd like a referral to [facility].

## 2020-01-12 ENCOUNTER — Ambulatory Visit: Payer: Self-pay | Admitting: Family Medicine

## 2020-01-17 ENCOUNTER — Telehealth: Payer: Self-pay | Admitting: Family Medicine

## 2020-01-17 ENCOUNTER — Telehealth: Payer: Self-pay | Admitting: General Practice

## 2020-01-17 ENCOUNTER — Other Ambulatory Visit: Payer: Self-pay | Admitting: Family Medicine

## 2020-01-17 DIAGNOSIS — M25471 Effusion, right ankle: Secondary | ICD-10-CM

## 2020-01-17 DIAGNOSIS — S93491D Sprain of other ligament of right ankle, subsequent encounter: Secondary | ICD-10-CM

## 2020-01-17 DIAGNOSIS — M899 Disorder of bone, unspecified: Secondary | ICD-10-CM

## 2020-01-17 DIAGNOSIS — M949 Disorder of cartilage, unspecified: Secondary | ICD-10-CM

## 2020-01-17 MED ORDER — HYDROCODONE-ACETAMINOPHEN 5-325 MG PO TABS: 1.0000 | ORAL_TABLET | Freq: Three times a day (TID) | ORAL | 0 refills | Status: DC | PRN

## 2020-01-17 NOTE — Telephone Encounter (Signed)
Called [facility]/Dr. Duda's office @ [phone] spk w/ Cassandra who confirmed rcpt of referral & says they have access to Epic& can see OV notes & all pt's information.  --Patient to  Be contacted by their office to set up appt.  --glh

## 2020-01-17 NOTE — Telephone Encounter (Signed)
Patient unable to be seen due to initial co-pay.  Will refill Norco.  Will send to Ortho care.  Can swing by to get back to samples.  Myra Rude, MD [facility] 01/17/2020, 1:55 PM

## 2020-01-17 NOTE — Telephone Encounter (Signed)
Patient called requested refill on hydrocodone & also requested provider call him back @ [phone].  --glh

## 2020-01-18 ENCOUNTER — Ambulatory Visit: Payer: Self-pay | Admitting: Family Medicine

## 2020-01-20 ENCOUNTER — Encounter: Payer: Self-pay | Admitting: Orthopedic Surgery

## 2020-01-20 ENCOUNTER — Ambulatory Visit (INDEPENDENT_AMBULATORY_CARE_PROVIDER_SITE_OTHER): Payer: Self-pay | Admitting: Orthopedic Surgery

## 2020-01-20 VITALS — Ht 71.0 in | Wt 210.0 lb

## 2020-01-20 DIAGNOSIS — S93401A Sprain of unspecified ligament of right ankle, initial encounter: Secondary | ICD-10-CM

## 2020-01-21 ENCOUNTER — Other Ambulatory Visit: Payer: Self-pay | Admitting: Family Medicine

## 2020-01-21 ENCOUNTER — Other Ambulatory Visit: Payer: Self-pay

## 2020-01-21 DIAGNOSIS — S93491D Sprain of other ligament of right ankle, subsequent encounter: Secondary | ICD-10-CM

## 2020-01-21 DIAGNOSIS — M25471 Effusion, right ankle: Secondary | ICD-10-CM

## 2020-01-21 DIAGNOSIS — M899 Disorder of bone, unspecified: Secondary | ICD-10-CM

## 2020-01-23 ENCOUNTER — Other Ambulatory Visit: Payer: Self-pay | Admitting: Family Medicine

## 2020-01-23 ENCOUNTER — Encounter: Payer: Self-pay | Admitting: Orthopedic Surgery

## 2020-01-23 DIAGNOSIS — S93491D Sprain of other ligament of right ankle, subsequent encounter: Secondary | ICD-10-CM

## 2020-01-23 DIAGNOSIS — M899 Disorder of bone, unspecified: Secondary | ICD-10-CM

## 2020-01-23 DIAGNOSIS — M25471 Effusion, right ankle: Secondary | ICD-10-CM

## 2020-01-23 NOTE — Progress Notes (Signed)
Office Visit Note   Patient: Melvin Hoffman           Date of Birth: 09-19-1985           MRN: [id number] Visit Date: 01/20/2020              Requested by: Myra Rude, MD [address],  [place] [address] PCP: Patient, No Pcp Per  Chief Complaint  Patient presents with   Right Ankle - Pain, New Patient (Initial Visit)      HPI: Patient is a 34 year old gentleman who was seen for initial evaluation for a right ankle sprain.  Patient has had an MRI scan obtained of his ankle he states he was also given an injection about 2 weeks ago.  He states he still has the same pain with swelling and pain over the medial and lateral aspect of his ankle.  Patient states the initial injury was while playing basketball on May 28 he states he had a supination external rotation injury.  He states he has been in the cam walker for a month.  Assessment & Plan: Visit Diagnoses:  1. Sprain of unspecified ligament of right ankle, initial encounter     Plan: Recommended continue with immobilization strengthening and range of motion.  Order placed for physical therapy.  Follow-Up Instructions: Return in about 4 weeks (around 02/17/2020).   Ortho Exam  Patient is alert, oriented, no adenopathy, well-dressed, normal affect, normal respiratory effort. Examination patient has swelling medially and laterally.  The proximal fibula is nontender to palpation the syndesmosis is nontender to compression.  He has global pain over the medial and lateral joint line.  Review of the MRI scan shows a small osteochondral lesion of the medial talar dome with out any bony edema.  There is edema in the syndesmosis without displacement.  There is edema in the medial and lateral ankle ligaments.  Patient resists anterior drawer secondary to pain.  Imaging: No results found. No images are attached to the encounter.  Labs: No results found for: HGBA1C, ESRSEDRATE, CRP, LABURIC, REPTSTATUS, GRAMSTAIN,  CULT, LABORGA   No results found for: ALBUMIN, PREALBUMIN, LABURIC  No results found for: MG No results found for: VD25OH  No results found for: PREALBUMIN No flowsheet data found.   Body mass index is 29.29 kg/m.  Orders:  Orders Placed This Encounter  Procedures   Ambulatory referral to Physical Therapy   No orders of the defined types were placed in this encounter.    Procedures: No procedures performed  Clinical Data: No additional findings.  ROS:  All other systems negative, except as noted in the HPI. Review of Systems  Objective: Vital Signs: Ht 5\' 11"  (1.803 m)    Wt (!) 210 lb (95.3 kg)    BMI 29.29 kg/m   Specialty Comments:  No specialty comments available.  PMFS History: Patient Active Problem List   Diagnosis Date Noted   Sprain of anterior talofibular ligament of right ankle 12/21/2019   High ankle sprain of right lower extremity 12/21/2019   Osteochondral lesion of talar dome 12/21/2019   Ankle effusion, right 12/08/2019   Past Medical History:  Diagnosis Date   Lumbar herniated disc     History reviewed. No pertinent family history.  History reviewed. No pertinent surgical history. Social History   Occupational History   Not on file  Tobacco Use   Smoking status: Never Smoker   Smokeless tobacco: Never Used  Substance and  Sexual Activity   Alcohol use: Yes    Comment: weekly   Drug use: No   Sexual activity: Not on file

## 2020-01-25 ENCOUNTER — Encounter: Payer: Self-pay | Admitting: Family Medicine

## 2020-01-25 ENCOUNTER — Ambulatory Visit (INDEPENDENT_AMBULATORY_CARE_PROVIDER_SITE_OTHER): Payer: Self-pay | Admitting: Family Medicine

## 2020-01-25 ENCOUNTER — Other Ambulatory Visit: Payer: Self-pay

## 2020-01-25 DIAGNOSIS — M899 Disorder of bone, unspecified: Secondary | ICD-10-CM

## 2020-01-25 DIAGNOSIS — M949 Disorder of cartilage, unspecified: Secondary | ICD-10-CM

## 2020-01-25 DIAGNOSIS — M25471 Effusion, right ankle: Secondary | ICD-10-CM

## 2020-01-25 DIAGNOSIS — S93491D Sprain of other ligament of right ankle, subsequent encounter: Secondary | ICD-10-CM

## 2020-01-25 MED ORDER — HYDROCODONE-ACETAMINOPHEN 5-325 MG PO TABS: 1.0000 | ORAL_TABLET | Freq: Three times a day (TID) | ORAL | 0 refills | Status: DC | PRN

## 2020-01-25 NOTE — Progress Notes (Signed)
Medication Samples have been provided to the patient.  Drug name: Duexis       Strength: 800mg /26.6mg         Qty: 2 boxes  LOT:  Exp.Date: 09/2020  Dosing instructions: take 1 tablet by mouth three (3) times a day.  The patient has been instructed regarding the correct time, dose, and frequency of taking this medication, including desired effects and most common side effects.   10/2020, Kathi Simpers 3:34 PM 01/25/2020

## 2020-01-25 NOTE — Patient Instructions (Signed)
Good to see you Please continue to ice  Physical therapy will give you a call  You can try transitioning out of the boot into the lace up ankle brace as you tolerate  Please send me a message in MyChart with any questions or updates.  Please see [place] back as needed.   --Dr. Jordan Likes

## 2020-01-25 NOTE — Assessment & Plan Note (Signed)
Pain slowly improving.  - continue CAM walker

## 2020-01-25 NOTE — Assessment & Plan Note (Signed)
Pain is ongoing.  Has met with Ortho.  Suggested physical therapy at this time. -Counseled on home exercise therapy and supportive care. -Referral to physical therapy. -Refill Norco. -Provided Duexis samples. -He will follow up with Ortho in a month.

## 2020-01-25 NOTE — Progress Notes (Signed)
  Melvin Hoffman - 34 y.o. male MRN [id number]  Date of birth: [birth year]  SUBJECTIVE:  Including CC & ROS.  Chief Complaint  Patient presents with  . Follow-up    right ankle    Melvin Hoffman is a 34 y.o. male that is following up for his right ankle pain.  Has been to see the surgeon and suggested physical therapy.  His pain is ongoing.  He has pain over the posterior aspect.  His range of motion continues to improve.  He continues to use the cam walker and crutches.   Review of Systems See HPI   HISTORY: Past Medical, Surgical, Social, and Family History Reviewed & Updated per EMR.   Pertinent Historical Findings include:  Past Medical History:  Diagnosis Date  . Lumbar herniated disc     No past surgical history on file.  No family history on file.  Social History   Socioeconomic History  . Marital status: Single    Spouse name: Not on file  . Number of children: Not on file  . Years of education: Not on file  . Highest education level: Not on file  Occupational History  . Not on file  Tobacco Use  . Smoking status: Never Smoker  . Smokeless tobacco: Never Used  Substance and Sexual Activity  . Alcohol use: Yes    Comment: weekly  . Drug use: No  . Sexual activity: Not on file  Other Topics Concern  . Not on file  Social History Narrative  . Not on file   Social Determinants of Health   Financial Resource Strain:   . Difficulty of Paying Living Expenses:   Food Insecurity:   . Worried About Charity fundraiser in the Last Year:   . Arboriculturist in the Last Year:   Transportation Needs:   . Film/video editor (Medical):   Marland Kitchen Lack of Transportation (Non-Medical):   Physical Activity:   . Days of Exercise per Week:   . Minutes of Exercise per Session:   Stress:   . Feeling of Stress :   Social Connections:   . Frequency of Communication with Friends and Family:   . Frequency of Social Gatherings with Friends and Family:   . Attends Religious Services:     . Active Member of Clubs or Organizations:   . Attends Archivist Meetings:   Marland Kitchen Marital Status:   Intimate Partner Violence:   . Fear of Current or Ex-Partner:   . Emotionally Abused:   Marland Kitchen Physically Abused:   . Sexually Abused:      PHYSICAL EXAM:  VS: Ht _0  (1.803 m)   Wt 210 lb (95.3 kg)   BMI 29.29 kg/m  Physical Exam Gen: NAD, alert, cooperative with exam, well-appearing MSK:  Right ankle: Effusion has improved.   Limited range of motion and plantar flexion dorsiflexion. Pain with weightbearing. Neurovascularly intact     ASSESSMENT & PLAN:   High ankle sprain of right lower extremity Pain is ongoing.  Has met with Ortho.  Suggested physical therapy at this time. -Counseled on home exercise therapy and supportive care. -Referral to physical therapy. -Refill Norco. -Provided Duexis samples. -He will follow up with Ortho in a month.  Osteochondral lesion of talar dome Pain slowly improving.  - continue CAM walker

## 2020-01-31 ENCOUNTER — Encounter: Payer: Self-pay | Admitting: Family Medicine

## 2020-01-31 ENCOUNTER — Other Ambulatory Visit: Payer: Self-pay | Admitting: Family Medicine

## 2020-01-31 DIAGNOSIS — M25471 Effusion, right ankle: Secondary | ICD-10-CM

## 2020-01-31 DIAGNOSIS — M899 Disorder of bone, unspecified: Secondary | ICD-10-CM

## 2020-01-31 DIAGNOSIS — M949 Disorder of cartilage, unspecified: Secondary | ICD-10-CM

## 2020-01-31 DIAGNOSIS — S93491D Sprain of other ligament of right ankle, subsequent encounter: Secondary | ICD-10-CM

## 2020-01-31 MED ORDER — HYDROCODONE-ACETAMINOPHEN 5-325 MG PO TABS: 1.0000 | ORAL_TABLET | Freq: Three times a day (TID) | ORAL | 0 refills | Status: DC | PRN

## 2020-01-31 NOTE — Progress Notes (Signed)
Refilled norco.   Myra Rude, MD [facility] 01/31/2020, 5:03 PM

## 2020-02-07 ENCOUNTER — Other Ambulatory Visit: Payer: Self-pay | Admitting: Family Medicine

## 2020-02-07 DIAGNOSIS — M25471 Effusion, right ankle: Secondary | ICD-10-CM

## 2020-02-07 DIAGNOSIS — S93491D Sprain of other ligament of right ankle, subsequent encounter: Secondary | ICD-10-CM

## 2020-02-07 DIAGNOSIS — M899 Disorder of bone, unspecified: Secondary | ICD-10-CM

## 2020-02-07 DIAGNOSIS — M949 Disorder of cartilage, unspecified: Secondary | ICD-10-CM

## 2020-02-07 MED ORDER — HYDROCODONE-ACETAMINOPHEN 5-325 MG PO TABS: 1.0000 | ORAL_TABLET | Freq: Three times a day (TID) | ORAL | 0 refills | Status: DC | PRN

## 2020-02-07 NOTE — Progress Notes (Signed)
Refilled norco.   Myra Rude, MD [facility] 02/07/2020, 2:00 PM

## 2020-02-17 ENCOUNTER — Ambulatory Visit: Payer: Self-pay | Admitting: Orthopedic Surgery

## 2020-02-18 ENCOUNTER — Other Ambulatory Visit: Payer: Self-pay | Admitting: Family Medicine

## 2020-02-18 DIAGNOSIS — S93491D Sprain of other ligament of right ankle, subsequent encounter: Secondary | ICD-10-CM

## 2020-02-18 DIAGNOSIS — M25471 Effusion, right ankle: Secondary | ICD-10-CM

## 2020-02-18 DIAGNOSIS — M949 Disorder of cartilage, unspecified: Secondary | ICD-10-CM

## 2020-02-18 MED ORDER — HYDROCODONE-ACETAMINOPHEN 5-325 MG PO TABS: 1.0000 | ORAL_TABLET | Freq: Three times a day (TID) | ORAL | 0 refills | Status: DC | PRN

## 2020-02-18 NOTE — Progress Notes (Signed)
Refill Norco.  Counseled on its use.  Myra Rude, MD [facility] 02/18/2020, 12:37 PM

## 2020-03-02 ENCOUNTER — Other Ambulatory Visit: Payer: Self-pay | Admitting: Family Medicine

## 2020-03-02 DIAGNOSIS — M25471 Effusion, right ankle: Secondary | ICD-10-CM

## 2020-03-02 DIAGNOSIS — M899 Disorder of bone, unspecified: Secondary | ICD-10-CM

## 2020-03-02 DIAGNOSIS — S93491D Sprain of other ligament of right ankle, subsequent encounter: Secondary | ICD-10-CM

## 2020-03-03 ENCOUNTER — Other Ambulatory Visit: Payer: Self-pay | Admitting: Family Medicine

## 2020-03-03 DIAGNOSIS — S93491D Sprain of other ligament of right ankle, subsequent encounter: Secondary | ICD-10-CM

## 2020-03-03 DIAGNOSIS — M899 Disorder of bone, unspecified: Secondary | ICD-10-CM

## 2020-03-03 DIAGNOSIS — M25471 Effusion, right ankle: Secondary | ICD-10-CM

## 2020-03-03 MED ORDER — HYDROCODONE-ACETAMINOPHEN 5-325 MG PO TABS: 1.0000 | ORAL_TABLET | Freq: Three times a day (TID) | ORAL | 0 refills | Status: DC | PRN

## 2020-03-03 MED ORDER — HYDROCODONE-ACETAMINOPHEN 5-325 MG PO TABS: 1.0000 | ORAL_TABLET | Freq: Three times a day (TID) | ORAL | 0 refills | Status: AC | PRN

## 2020-03-03 NOTE — Progress Notes (Signed)
Refilled norco.   Myra Rude, MD [facility] 03/03/2020, 12:45 PM

## 2020-03-16 ENCOUNTER — Other Ambulatory Visit: Payer: Self-pay | Admitting: Family Medicine

## 2020-03-16 DIAGNOSIS — M25471 Effusion, right ankle: Secondary | ICD-10-CM

## 2020-03-16 DIAGNOSIS — S93491D Sprain of other ligament of right ankle, subsequent encounter: Secondary | ICD-10-CM

## 2020-03-16 DIAGNOSIS — M899 Disorder of bone, unspecified: Secondary | ICD-10-CM

## 2020-11-30 IMAGING — CR DG ANKLE COMPLETE 3+V*R*
3 series · 3 of 3 positions shown · non-contrast
Comparison: November 20, 2019.

CLINICAL DATA: Acute right ankle pain.

EXAM:
RIGHT ANKLE - COMPLETE 3+ VIEW

[t ankle joint ap right]
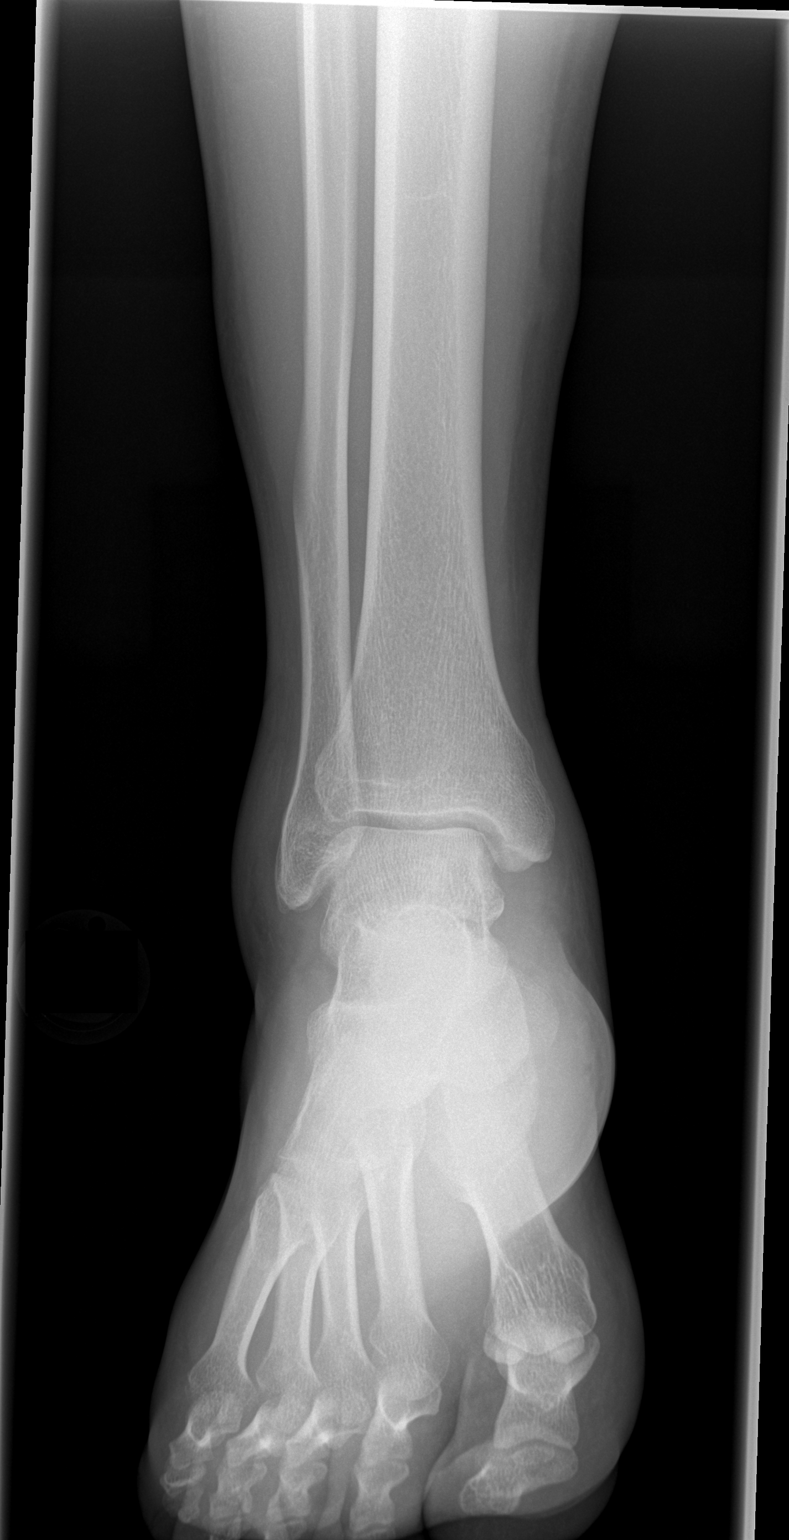

[t ankle joint oblique right]
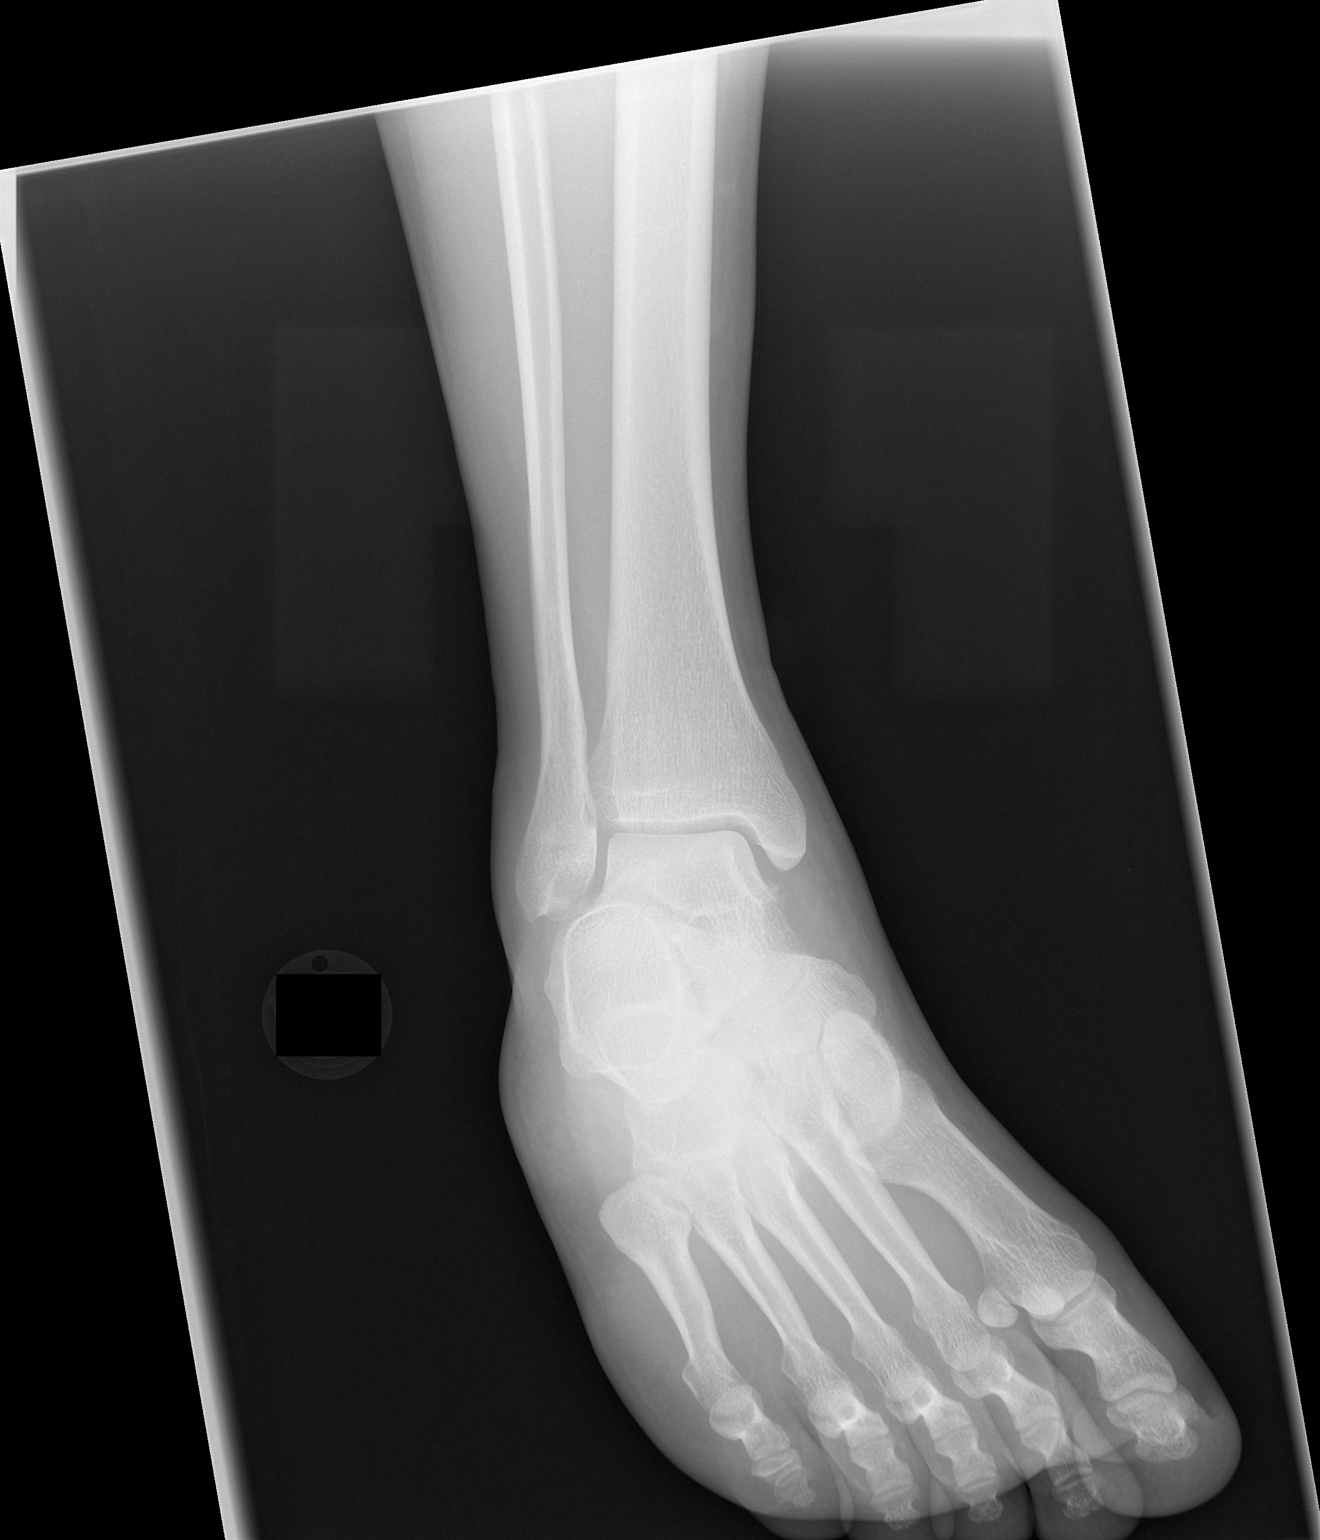

[t ankle joint lat right]
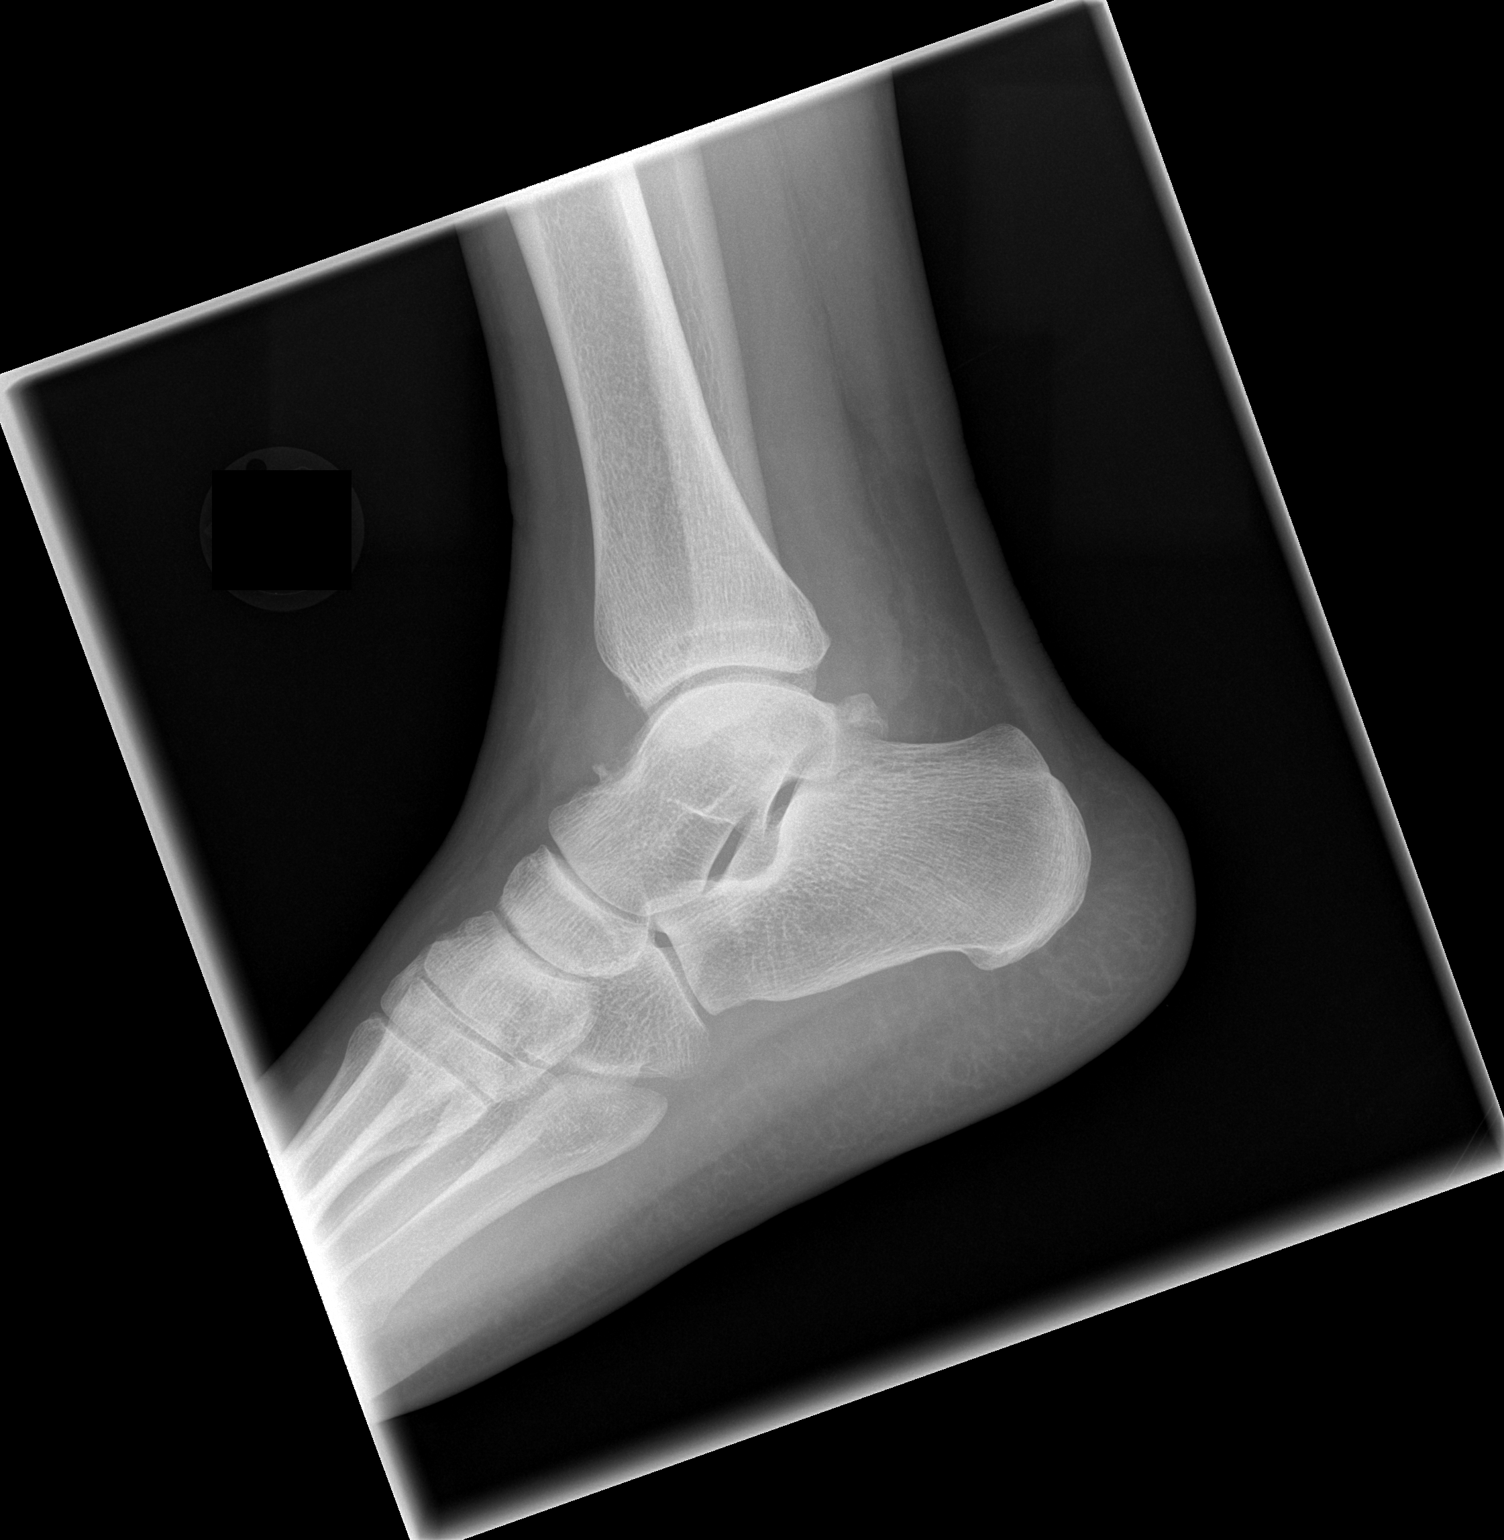

[3 of 3 positions shown; findings below may reference images not displayed]

FINDINGS: There is no evidence of fracture or dislocation. Small joint
effusion may remain. There is no evidence of arthropathy or other
focal bone abnormality. Soft tissues are unremarkable.
IMPRESSION: No fracture or dislocation is noted. Small joint effusion may be
present.

## 2022-10-07 ENCOUNTER — Encounter: Payer: Self-pay | Admitting: *Deleted
